# Patient Record
Sex: Female | Born: 1955 | Race: White | Hispanic: No | Marital: Married | Smoking: Never smoker
Health system: Southern US, Community
[De-identification: ages and names within clinical notes are randomized; demographics above are authoritative.]

## PROBLEM LIST (undated history)

## (undated) DIAGNOSIS — T7840XA Allergy, unspecified, initial encounter: Secondary | ICD-10-CM

## (undated) HISTORY — DX: Allergy, unspecified, initial encounter: T78.40XA

---
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
## 1998-11-07 ENCOUNTER — Other Ambulatory Visit: Admission: RE | Admit: 1998-11-07 | Discharge: 1998-11-07 | Payer: Self-pay | Admitting: Obstetrics and Gynecology

## 1999-12-08 ENCOUNTER — Other Ambulatory Visit: Admission: RE | Admit: 1999-12-08 | Discharge: 1999-12-08 | Payer: Self-pay | Admitting: Obstetrics and Gynecology

## 2000-12-31 ENCOUNTER — Other Ambulatory Visit: Admission: RE | Admit: 2000-12-31 | Discharge: 2000-12-31 | Payer: Self-pay | Admitting: Obstetrics and Gynecology

## 2002-03-16 ENCOUNTER — Other Ambulatory Visit: Admission: RE | Admit: 2002-03-16 | Discharge: 2002-03-16 | Payer: Self-pay | Admitting: Obstetrics and Gynecology

## 2010-05-23 ENCOUNTER — Ambulatory Visit: Payer: Self-pay | Admitting: Family Medicine

## 2010-11-07 ENCOUNTER — Encounter: Payer: Self-pay | Admitting: Medical

## 2010-11-07 ENCOUNTER — Ambulatory Visit (INDEPENDENT_AMBULATORY_CARE_PROVIDER_SITE_OTHER): Payer: BC Managed Care – PPO | Admitting: Medical

## 2010-11-07 VITALS — BP 110/88 | HR 80 | Temp 98.1°F | Wt 150.0 lb

## 2010-11-07 DIAGNOSIS — J309 Allergic rhinitis, unspecified: Secondary | ICD-10-CM

## 2010-11-07 DIAGNOSIS — J329 Chronic sinusitis, unspecified: Secondary | ICD-10-CM

## 2010-11-07 MED ORDER — FLUTICASONE PROPIONATE 50 MCG/ACT NA SUSP: 1.0000 | Freq: Every day | NASAL | Status: DC

## 2010-11-07 MED ORDER — AMOXICILLIN-POT CLAVULANATE 875-125 MG PO TABS: 1.0000 | ORAL_TABLET | Freq: Two times a day (BID) | ORAL | Status: AC

## 2010-11-07 NOTE — Progress Notes (Signed)
  Subjective:     Christine Downs is a 55 y.o. female who presents for evaluation of sinus pain. Symptoms include: congestion, cough, facial pain, frequent clearing of the throat, headaches, itchy eyes, itchy nose, nasal congestion, post nasal drip, sinus pressure and sore throat. Onset of symptoms was 3 weeks ago. Symptoms have been gradually worsening since that time. Past history is significant for Allergic rhinitis, history of sinus problems.. Patient is a non-smoker.  Using Allegra-D for symptoms. Has allergy problems in general, but Allegra d has not been helping. Has been on nasal spray before and like a refill of this.  Denies sick contacts.  No other aggravating or relieving factors.  No other c/o.  The following portions of the patient's history were reviewed and updated as appropriate: allergies, current medications, past family history, past medical history, past social history, past surgical history and problem list.  Review of Systems Constitutional: denies fever, chills, sweats, anorexia Skin: denies rash HEENT: denies ear pain Cardiovascular: denies chest pain Lungs: denies wheezing Abdomen: denies abdominal pain, nausea, vomiting, diarrhea GU: denies dysuria  Objective:   Filed Vitals:   11/07/10 1422  BP: 110/88  Pulse: 80  Temp: 98.1 F (36.7 C)    General appearance: Alert, WD/WN, no distress                             Skin: warm, no rash                           Head: + Maxillary sinus tenderness                            Eyes: conjunctiva normal, corneas clear, PERRLA                            Ears: pearly TMs, external ear canals normal                          Nose: septum midline, turbinates swollen, with erythema and clear discharge             Mouth/throat: MMM, tongue normal, mild pharyngeal erythema                           Neck: supple, no adenopathy, no thyromegaly, non tender                          Heart: RRR, normal S1, S2, no murmurs                Lungs: CTA bilaterally, no wheezes, rales, or rhonchi      Assessment:    Encounter Diagnoses  Name Primary?  . Sinusitis Yes  . Allergic rhinitis     Plan:   Prescription given for Augmentin for sinusitis. Fluticasone for allergic rhinitis. Can continue the Allegra D. for congestion.  Tylenol or Ibuprofen OTC for fever and malaise.  Discussed symptomatic relief, nasal saline, and call or return if worse or not improving in 2-3 days.

## 2010-11-07 NOTE — Patient Instructions (Signed)

## 2010-12-12 ENCOUNTER — Encounter: Payer: Self-pay | Admitting: Medical

## 2010-12-12 ENCOUNTER — Ambulatory Visit (INDEPENDENT_AMBULATORY_CARE_PROVIDER_SITE_OTHER): Payer: BC Managed Care – PPO | Admitting: Medical

## 2010-12-12 VITALS — BP 110/80 | HR 76 | Temp 98.3°F | Ht 65.0 in | Wt 150.0 lb

## 2010-12-12 DIAGNOSIS — J40 Bronchitis, not specified as acute or chronic: Secondary | ICD-10-CM

## 2010-12-12 MED ORDER — PREDNISONE 20 MG PO TABS: ORAL_TABLET | ORAL | Status: DC

## 2010-12-12 MED ORDER — AZITHROMYCIN 250 MG PO TABS: ORAL_TABLET | ORAL | Status: AC

## 2010-12-12 NOTE — Progress Notes (Signed)
  Subjective:     Christine Downs is a 55 y.o. female who presents for evaluation of productive cough with sputum described as yellow.  Symptoms include congestion and post nasal drip. She saw me one month ago for a sinus infection. I had prescribed Augmentin, but she thinks the pharmacy gave her amoxicillin and stated. The sinus pressure has resolved, but she thinks she never fully got over symptoms and now is in her chest. In the past Z-Pak has worked better for this type of thing for her.  Denies sick contacts.  No other aggravating or relieving factors.  No other c/o.  The following portions of the patient's history were reviewed and updated as appropriate: allergies, current medications, past family history, past medical history, past social history, past surgical history and problem list.  Past Medical History  Diagnosis Date  . Chronic sinusitis   . Allergy     Review of Systems Constitutional:  denies fever, chills, sweats, anorexia Skin: denies rash HEENT: denies ear pain, sore throat, itchy watery eyes Cardiovascular: denies chest pain, palpitations Lungs:  +productive sputum; denies wheezing, hemoptysis, orthopnea, PND Abdomen: denies abdominal pain, nausea, vomiting, diarrhea GU: denies dysuria Extremities: denies edema, myalgias, arthralgias  Objective:   Filed Vitals:   12/12/10 1500  BP: 110/80  Pulse: 76  Temp: 98.3 F (36.8 C)    General appearance: Alert, WD/WN, no distress, ill appearing                             Skin: warm, no rash, no diaphoresis                           Head: no sinus tenderness                            Eyes: conjunctiva normal, corneas clear, PERRLA                            Ears: Serous effusions behind both TMs, external ear canals normal                          Nose: septum midline, turbinates swollen, with erythema and clear discharge             Mouth/throat: MMM, tongue normal, mild pharyngeal erythema   Neck: supple, no adenopathy, no thyromegaly, nontender                          Heart: RRR, normal S1, S2, no murmurs                         Lungs: +bronchial breath sounds, +scattered rhonchi on the right, no wheezes, no rales                Extremities: no edema, nontender     Assessment:   Encounter Diagnosis  Name Primary?  . Bronchitis Yes    Plan:   Prescription given today for Z-Pak as below.  Discussed diagnosis and treatment of bronchitis.  Suggested symptomatic OTC remedies for cough and congestion.  Nasal saline spray for nasal congestion.  Tylenol or Ibuprofen OTC for fever and malaise.  However if worsening or not improving call or return.

## 2010-12-15 ENCOUNTER — Encounter: Payer: Self-pay | Admitting: Family Medicine

## 2011-06-23 HISTORY — PX: OTHER SURGICAL HISTORY: SHX169

## 2011-09-30 ENCOUNTER — Other Ambulatory Visit: Payer: Self-pay | Admitting: Family Medicine

## 2011-09-30 DIAGNOSIS — Z1231 Encounter for screening mammogram for malignant neoplasm of breast: Secondary | ICD-10-CM

## 2011-10-15 ENCOUNTER — Encounter: Payer: Self-pay | Admitting: Family Medicine

## 2011-10-15 ENCOUNTER — Ambulatory Visit (INDEPENDENT_AMBULATORY_CARE_PROVIDER_SITE_OTHER): Payer: BC Managed Care – PPO | Admitting: Family Medicine

## 2011-10-15 ENCOUNTER — Other Ambulatory Visit (HOSPITAL_COMMUNITY)
Admission: RE | Admit: 2011-10-15 | Discharge: 2011-10-15 | Disposition: A | Discharge status: Home / Self Care | Payer: BC Managed Care – PPO | Source: Ambulatory Visit | Attending: Family Medicine | Admitting: Family Medicine

## 2011-10-15 VITALS — BP 130/80 | HR 67 | Ht 65.5 in | Wt 156.0 lb

## 2011-10-15 DIAGNOSIS — Z23 Encounter for immunization: Secondary | ICD-10-CM

## 2011-10-15 DIAGNOSIS — Z01419 Encounter for gynecological examination (general) (routine) without abnormal findings: Secondary | ICD-10-CM | POA: Insufficient documentation

## 2011-10-15 DIAGNOSIS — J301 Allergic rhinitis due to pollen: Secondary | ICD-10-CM

## 2011-10-15 DIAGNOSIS — Z0000 Encounter for general adult medical examination without abnormal findings: Secondary | ICD-10-CM

## 2011-10-15 LAB — CBC WITH DIFFERENTIAL/PLATELET
Basophils Absolute: 0 10*3/uL (ref 0.0–0.1)
Basophils Relative: 1 % (ref 0–1)
Eosinophils Relative: 3 % (ref 0–5)
HCT: 40.1 % (ref 36.0–46.0)
Lymphocytes Relative: 39 % (ref 12–46)
MCHC: 33.2 g/dL (ref 30.0–36.0)
MCV: 92 fL (ref 78.0–100.0)
Monocytes Absolute: 0.4 10*3/uL (ref 0.1–1.0)
Monocytes Relative: 7 % (ref 3–12)
RDW: 13.9 % (ref 11.5–15.5)

## 2011-10-15 LAB — LIPID PANEL
Cholesterol: 227 mg/dL — ABNORMAL HIGH (ref 0–200)
HDL: 71 mg/dL (ref >39)
LDL Cholesterol: 140 mg/dL — ABNORMAL HIGH (ref 0–99)
Total CHOL/HDL Ratio: 3.2 Ratio
Triglycerides: 82 mg/dL (ref <150)
VLDL: 16 mg/dL (ref 0–40)

## 2011-10-15 LAB — POCT URINALYSIS DIPSTICK
Blood, UA: NEGATIVE
Glucose, UA: NEGATIVE
Nitrite, UA: NEGATIVE
Protein, UA: NEGATIVE
Spec Grav, UA: 1.01
Urobilinogen, UA: NEGATIVE
pH, UA: 7

## 2011-10-15 LAB — COMPREHENSIVE METABOLIC PANEL
AST: 19 U/L (ref 0–37)
BUN: 18 mg/dL (ref 6–23)
Calcium: 10.2 mg/dL (ref 8.4–10.5)
Chloride: 106 mEq/L (ref 96–112)
Creat: 0.87 mg/dL (ref 0.50–1.10)
Glucose, Bld: 95 mg/dL (ref 70–99)

## 2011-10-15 MED ORDER — FLUTICASONE PROPIONATE 50 MCG/ACT NA SUSP: 1.0000 | Freq: Every day | NASAL | Status: AC

## 2011-10-15 NOTE — Progress Notes (Signed)
  Subjective:    Patient ID: Christine Downs, female    DOB: [birth year], 56 y.o.   MRN: [id number]  HPI She is here for complete examination. She does have underlying allergies and uses Flonase for this as well as Zyrtec. 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 Her activity level is somewhat limited. She drinks socially. Her marriage is going well. She does not smoke cigarettes.   Review of Systems  Constitutional: Negative.   HENT: Negative.   Eyes: Negative.   Respiratory: Negative.   Cardiovascular: Negative.   Gastrointestinal: Negative.   Genitourinary: Negative.   Musculoskeletal: Negative.   Skin: Negative.   Neurological: Negative.   Hematological: Negative.   Psychiatric/Behavioral: Negative.        Objective:   Physical Exam BP 130/80  Pulse 67  Ht 5' 5.5" (1.664 m)  Wt 156 lb (70.761 kg)  BMI 25.56 kg/m2  General Appearance:    Alert, cooperative, no distress, appears stated age  Head:    Normocephalic, without obvious abnormality, atraumatic  Eyes:    PERRL, conjunctiva/corneas clear, EOM's intact, fundi    benign  Ears:    Normal TM's and external ear canals  Nose:   Nares normal, mucosa normal, no drainage or sinus   tenderness  Throat:   Lips, mucosa, and tongue normal; teeth and gums normal  Neck:   Supple, no lymphadenopathy;  thyroid:  no   enlargement/tenderness/nodules; no carotid   bruit or JVD  Back:    Spine nontender, no curvature, ROM normal, no CVA     tenderness  Lungs:     Clear to auscultation bilaterally without wheezes, rales or     ronchi; respirations unlabored  Chest Wall:    No tenderness or deformity   Heart:    Regular rate and rhythm, S1 and S2 normal, no murmur, rub   or gallop  Breast Exam:   deferred patient will be going for mammogram   Abdomen:     Soft, non-tender, nondistended, normoactive bowel sounds,    no masses, no hepatosplenomegaly  Genitalia:    Normal external genitalia without lesions.  BUS and vagina normal;  cervix without lesions, or cervical motion tenderness. No abnormal vaginal discharge.  Uterus and adnexa not enlarged, nontender, no masses.  Pap performed  Rectal:    deferred   Extremities:   No clubbing, cyanosis or edema  Pulses:   2+ and symmetric all extremities  Skin:   Skin color, texture, turgor normal, no rashes or lesions  Lymph nodes:   Cervical, supraclavicular, and axillary nodes normal  Neurologic:   CNII-XII intact, normal strength, sensation and gait; reflexes 2+ and symmetric throughout          Psych:   Normal mood, affect, hygiene and grooming.          Assessment & Plan:   1. Routine general medical examination at a health care facility  Tdap vaccine greater than or equal to 7yo IM, CBC with Differential, Comprehensive metabolic panel, Lipid panel, Cytology - PAP, POCT Urinalysis Dipstick  2. Allergic rhinitis due to pollen     she will check with her insurance currently concerning getting a colonoscopy. Discussed various options with her. I will renew her Flonase. Discussed making dietary and exercise changes. Specifically mentioned cutting back on carbohydrates. He Also given.

## 2011-10-28 ENCOUNTER — Ambulatory Visit
Admission: RE | Admit: 2011-10-28 | Discharge: 2011-10-28 | Disposition: A | Discharge status: Home / Self Care | Payer: BC Managed Care – PPO | Source: Ambulatory Visit | Attending: Family Medicine | Admitting: Family Medicine

## 2011-10-28 DIAGNOSIS — Z1231 Encounter for screening mammogram for malignant neoplasm of breast: Secondary | ICD-10-CM

## 2012-04-07 ENCOUNTER — Telehealth: Payer: Self-pay | Admitting: Family Medicine

## 2012-04-07 NOTE — Telephone Encounter (Signed)
This is okay.

## 2012-04-07 NOTE — Telephone Encounter (Signed)
I think this was to come to you 

## 2012-04-11 ENCOUNTER — Other Ambulatory Visit (INDEPENDENT_AMBULATORY_CARE_PROVIDER_SITE_OTHER): Payer: BC Managed Care – PPO

## 2012-04-11 DIAGNOSIS — Z23 Encounter for immunization: Secondary | ICD-10-CM

## 2012-06-17 ENCOUNTER — Other Ambulatory Visit: Payer: Self-pay | Admitting: Medical

## 2012-06-17 ENCOUNTER — Telehealth: Payer: Self-pay | Admitting: Internal Medicine

## 2012-06-17 MED ORDER — SCOPOLAMINE 1 MG/3DAYS TD PT72: 1.0000 | MEDICATED_PATCH | TRANSDERMAL | Status: DC

## 2012-06-17 NOTE — Telephone Encounter (Signed)
pt is leaving sunday to go on a cruise and would like the sea sick patch. send to [facility] on [address] and [address]

## 2012-06-21 NOTE — Telephone Encounter (Signed)
Done

## 2012-12-08 ENCOUNTER — Encounter: Payer: Self-pay | Admitting: Family Medicine

## 2012-12-08 ENCOUNTER — Encounter: Payer: Self-pay | Admitting: Internal Medicine

## 2012-12-08 ENCOUNTER — Ambulatory Visit (INDEPENDENT_AMBULATORY_CARE_PROVIDER_SITE_OTHER): Payer: BC Managed Care – PPO | Admitting: Family Medicine

## 2012-12-08 VITALS — BP 120/70 | HR 78 | Ht 64.0 in | Wt 146.0 lb

## 2012-12-08 DIAGNOSIS — Z0000 Encounter for general adult medical examination without abnormal findings: Secondary | ICD-10-CM

## 2012-12-08 DIAGNOSIS — J309 Allergic rhinitis, unspecified: Secondary | ICD-10-CM

## 2012-12-08 LAB — POCT URINALYSIS DIPSTICK
Blood, UA: NEGATIVE
Ketones, UA: NEGATIVE
Protein, UA: NEGATIVE
Spec Grav, UA: <=1.005
Urobilinogen, UA: NEGATIVE

## 2012-12-08 NOTE — Progress Notes (Signed)
  Subjective:    Patient ID: Christine Downs, female    DOB: 06/02/1956, 57 y.o.   MRN: [id number]  HPI He is here for complete examination. She does have underlying allergies and is on a nasal spray and Zyrtec 10 having no difficulty with this. She has no other concerns or complaints. Review of her record indicates her immunizations are up-to-date and she does need a colonoscopy. Social and family history were reviewed.   Review of Systems  Constitutional: Negative.   HENT: Negative.   Eyes: Negative.   Respiratory: Negative.   Cardiovascular: Negative.   Endocrine: Negative.   Genitourinary: Negative.   Allergic/Immunologic: Negative.   Neurological: Negative.   Hematological: Negative.   Psychiatric/Behavioral: Negative.        Objective:   Physical Exam BP 120/70  Pulse 78  Ht 5\' 4"  (1.626 m)  Wt 146 lb (66.225 kg)  BMI 25.05 kg/m2  General Appearance:    Alert, cooperative, no distress, appears stated age  Head:    Normocephalic, without obvious abnormality, atraumatic  Eyes:    PERRL, conjunctiva/corneas clear, EOM's intact, fundi    benign  Ears:    Normal TM's and external ear canals  Nose:   Nares normal, mucosa normal, no drainage or sinus   tenderness  Throat:   Lips, mucosa, and tongue normal; teeth and gums normal  Neck:   Supple, no lymphadenopathy;  thyroid:  no   enlargement/tenderness/nodules; no carotid   bruit or JVD  Back:    Spine nontender, no curvature, ROM normal, no CVA     tenderness  Lungs:     Clear to auscultation bilaterally without wheezes, rales or     ronchi; respirations unlabored  Chest Wall:    No tenderness or deformity   Heart:    Regular rate and rhythm, S1 and S2 normal, no murmur, rub   or gallop  Breast Exam:    Deferred to GYN  Abdomen:     Soft, non-tender, nondistended, normoactive bowel sounds,    no masses, no hepatosplenomegaly  Genitalia:    Deferred to GYN     Extremities:   No clubbing, cyanosis or edema  Pulses:   2+  and symmetric all extremities  Skin:   Skin color, texture, turgor normal, no rashes or lesions  Lymph nodes:   Cervical, supraclavicular, and axillary nodes normal  Neurologic:   CNII-XII intact, normal strength, sensation and gait; reflexes 2+ and symmetric throughout          Psych:   Normal mood, affect, hygiene and grooming.           Assessment & Plan:  Routine general medical examination at a health care facility - Plan: POCT urinalysis dipstick, Lipid panel, HM COLONOSCOPY  Allergic rhinitis

## 2012-12-09 LAB — LIPID PANEL
Cholesterol: 211 mg/dL — ABNORMAL HIGH (ref 0–200)
LDL Cholesterol: 124 mg/dL — ABNORMAL HIGH (ref 0–99)
Total CHOL/HDL Ratio: 2.9 Ratio
VLDL: 14 mg/dL (ref 0–40)

## 2012-12-09 NOTE — Progress Notes (Signed)
Quick Note:  CALLED PT SHE WAS INFORMED LIPIDS LOOK GOOD AND VERBALIZED UNDERSTANDING ______

## 2013-02-07 ENCOUNTER — Ambulatory Visit (AMBULATORY_SURGERY_CENTER): Payer: BC Managed Care – PPO | Admitting: *Deleted

## 2013-02-07 ENCOUNTER — Encounter: Payer: Self-pay | Admitting: Internal Medicine

## 2013-02-07 VITALS — Ht 64.5 in | Wt 150.6 lb

## 2013-02-07 DIAGNOSIS — Z1211 Encounter for screening for malignant neoplasm of colon: Secondary | ICD-10-CM

## 2013-02-07 MED ORDER — MOVIPREP 100 G PO SOLR: ORAL | Status: DC

## 2013-02-07 NOTE — Progress Notes (Signed)
No allergies to eggs or soy. No problems with anesthesia.  

## 2013-02-16 ENCOUNTER — Encounter: Payer: BC Managed Care – PPO | Admitting: Internal Medicine

## 2013-02-21 ENCOUNTER — Telehealth: Payer: Self-pay | Admitting: Internal Medicine

## 2013-02-21 NOTE — Telephone Encounter (Signed)
Yes. Appears to be a habitual canceler.

## 2013-02-23 ENCOUNTER — Encounter: Payer: BC Managed Care – PPO | Admitting: Internal Medicine

## 2013-04-27 ENCOUNTER — Other Ambulatory Visit: Payer: Self-pay

## 2014-04-06 ENCOUNTER — Other Ambulatory Visit: Payer: Self-pay

## 2014-06-05 ENCOUNTER — Encounter: Payer: Self-pay | Admitting: Medical

## 2014-06-05 ENCOUNTER — Ambulatory Visit (INDEPENDENT_AMBULATORY_CARE_PROVIDER_SITE_OTHER): Payer: BC Managed Care – PPO | Admitting: Medical

## 2014-06-05 VITALS — BP 120/80 | HR 62 | Temp 98.7°F | Wt 167.0 lb

## 2014-06-05 DIAGNOSIS — J0110 Acute frontal sinusitis, unspecified: Secondary | ICD-10-CM

## 2014-06-05 MED ORDER — PREDNISONE 20 MG PO TABS: ORAL_TABLET | ORAL | Status: DC

## 2014-06-05 MED ORDER — AZITHROMYCIN 250 MG PO TABS: ORAL_TABLET | ORAL | Status: DC

## 2014-06-05 NOTE — Progress Notes (Signed)
Subjective:  Christine Downs is a 58 y.o. female who presents for possible sinus infection.  Symptoms include 3 weeks of head congestion, sinus pressure, green nasal drainage, sore throat from drainage, coughing up some phlegm from drainage, some aches, fatigue.   Uses nasonex, but this seems to hurt now.  Has had subjective fever.  Using Advil q6 hours.    Denies ear pain, NVD.  Patient is a non-smoker.  Using Sudafed, Advil, and Nasonex for symptoms.  Denies sick contacts.  Going out of the country Sunday to [place] with [place] family.  No other aggravating or relieving factors.  No other c/o.  ROS as in subjective   Objective: Filed Vitals:   06/05/14 0904  BP: 120/80  Pulse: 62  Temp: 98.7 F (37.1 C)    General appearance: Alert, WD/WN, no distress                             Skin: warm, no rash                           Head: +frontal sinus tenderness,                            Eyes: conjunctiva normal, corneas clear, PERRLA                            Ears: bilat serous effusions behind TMs, external ear canals normal                          Nose: septum midline, turbinates swollen, with erythema and clear discharge             Mouth/throat: MMM, tongue normal, mild pharyngeal erythema                           Neck: supple, no adenopathy, no thyromegaly, nontender                          Lungs: CTA bilaterally, no wheezes, rales, or rhonchi      Assessment and Plan: Encounter Diagnosis  Name Primary?  . Acute frontal sinusitis, recurrence not specified Yes     Prescription given for few days of prednisone and Zpak.  Can use OTC Mucinex for congestion.  Tylenol or Ibuprofen OTC for fever and malaise.  Discussed symptomatic relief, nasal saline flush, and call or return if worse or not improving in 2-3 days.

## 2014-11-21 ENCOUNTER — Encounter: Payer: Self-pay | Admitting: Medical

## 2014-11-21 ENCOUNTER — Ambulatory Visit (INDEPENDENT_AMBULATORY_CARE_PROVIDER_SITE_OTHER): Payer: BLUE CROSS/BLUE SHIELD | Admitting: Medical

## 2014-11-21 VITALS — BP 110/70 | HR 76 | Temp 98.8°F | Resp 15 | Wt 157.0 lb

## 2014-11-21 DIAGNOSIS — J0110 Acute frontal sinusitis, unspecified: Secondary | ICD-10-CM | POA: Diagnosis not present

## 2014-11-21 MED ORDER — AZITHROMYCIN 250 MG PO TABS: ORAL_TABLET | ORAL | Status: DC

## 2014-11-21 NOTE — Progress Notes (Signed)
Subjective:  Christine Downs is a 59 y.o. female who presents for possible sinus infection.  Symptoms include 1 weeks of head congestion, ear pressure, sinus pressure, green nasal drainage, sore throat from drainage, fatigue.   Uses Flonase and some sudafed.  No fever, no NVD.  Patient is a non-smoker.  Denies sick contacts.  Going out of the country next [place] to [place].  No other aggravating or relieving factors.  No other c/o.  ROS as in subjective   Objective: Filed Vitals:   11/21/14 1505  BP: 110/70  Pulse: 76  Temp: 98.8 F (37.1 C)  Resp: 15    General appearance: Alert, WD/WN, no distress                             Skin: warm, no rash                           Head: +frontal sinus tenderness,                            Eyes: conjunctiva normal, corneas clear, PERRLA                            Ears: flat TMs, external ear canals normal                          Nose: septum midline, turbinates swollen, with erythema and clear discharge             Mouth/throat: MMM, tongue normal, mild pharyngeal erythema                           Neck: supple, no adenopathy, no thyromegaly, nontender                          Lungs: CTA bilaterally, no wheezes, rales, or rhonchi      Assessment and Plan: Encounter Diagnosis  Name Primary?  . Acute frontal sinusitis, recurrence not specified Yes    Prescription given for Zpak at her request.  Can use OTCsudafed for congestion, flonase, neti pot.  Tylenol or Ibuprofen OTC for fever and malaise.  Discussed symptomatic relief, nasal saline flush, and call or return if worse or not improving in 2-3 days.

## 2014-12-10 ENCOUNTER — Telehealth: Payer: Self-pay | Admitting: Medical

## 2014-12-10 ENCOUNTER — Other Ambulatory Visit: Payer: Self-pay | Admitting: Medical

## 2014-12-10 MED ORDER — CEFUROXIME AXETIL 500 MG PO TABS: 500.0000 mg | ORAL_TABLET | Freq: Two times a day (BID) | ORAL | Status: DC

## 2014-12-10 NOTE — Telephone Encounter (Signed)
If not improved on this round, Ceftin, then recheck

## 2014-12-10 NOTE — Telephone Encounter (Signed)
Patient is aware of Shane Tysinger PA message 

## 2014-12-10 NOTE — Telephone Encounter (Signed)
Pt states got better but was on 3 different international flights, after 2nd flight her ear started hurting again.  Sinuses better but not well.  She wants another round of antibiotics and wants steroid that you had discussed send to [facility]

## 2015-04-15 ENCOUNTER — Ambulatory Visit (INDEPENDENT_AMBULATORY_CARE_PROVIDER_SITE_OTHER): Payer: BLUE CROSS/BLUE SHIELD | Admitting: Family Medicine

## 2015-04-15 ENCOUNTER — Encounter: Payer: Self-pay | Admitting: Family Medicine

## 2015-04-15 VITALS — BP 112/70 | HR 76 | Temp 98.1°F | Ht 64.0 in | Wt 153.0 lb

## 2015-04-15 DIAGNOSIS — J3089 Other allergic rhinitis: Secondary | ICD-10-CM

## 2015-04-15 DIAGNOSIS — J208 Acute bronchitis due to other specified organisms: Secondary | ICD-10-CM | POA: Diagnosis not present

## 2015-04-15 DIAGNOSIS — J0110 Acute frontal sinusitis, unspecified: Secondary | ICD-10-CM | POA: Diagnosis not present

## 2015-04-15 DIAGNOSIS — H6980 Other specified disorders of Eustachian tube, unspecified ear: Secondary | ICD-10-CM | POA: Insufficient documentation

## 2015-04-15 DIAGNOSIS — H6982 Other specified disorders of Eustachian tube, left ear: Secondary | ICD-10-CM

## 2015-04-15 DIAGNOSIS — H6990 Unspecified Eustachian tube disorder, unspecified ear: Secondary | ICD-10-CM | POA: Insufficient documentation

## 2015-04-15 MED ORDER — AMOXICILLIN-POT CLAVULANATE 875-125 MG PO TABS: 1.0000 | ORAL_TABLET | Freq: Two times a day (BID) | ORAL | Status: AC

## 2015-04-15 NOTE — Progress Notes (Signed)
   Subjective:    Patient ID: Christine Downs, female    DOB: 05/10/1956, 59 y.o.   MRN: [id number]  HPI She complains of a two-day history that started with the relatively quick onset of hoarse voice and productive cough with a slight sore throat and continued difficulty with left ear pressure. No fever, chills, shortness of breath, chest pain. She does not smoke cigarettes .She has had long difficulty with left ear eustachian tube dysfunction. She notes this especially with flying which she recently did. She has been using Nasonex and did use Sudafed and Afrin for the recent flight however she says it did not help.   Review of Systems     Objective:   Physical Exam Alert and in no distress. Tympanic membranes and canals are normal. Pharyngeal area is normal. Neck is supple without adenopathy or thyromegaly. Cardiac exam shows a regular sinus rhythm without murmurs or gallops. Lungs are clear to auscultation. Nasal mucosa is red and slightly swollen with tenderness over frontal and maxillary sinuses.        Assessment & Plan:  Acute frontal sinusitis, recurrence not specified - Plan: amoxicillin-clavulanate (AUGMENTIN) 875-125 MG tablet  Acute bronchitis due to other specified organisms - Plan: amoxicillin-clavulanate (AUGMENTIN) 875-125 MG tablet  Other allergic rhinitis  recommend she continue to use Afrin and Sudafed when she flies. She will call if no improvement in her sinus symptoms. Continue on Nasonex.

## 2016-03-04 DIAGNOSIS — E8881 Metabolic syndrome: Secondary | ICD-10-CM | POA: Diagnosis not present

## 2016-04-28 DIAGNOSIS — Z0279 Encounter for issue of other medical certificate: Secondary | ICD-10-CM

## 2016-04-29 ENCOUNTER — Other Ambulatory Visit: Payer: Self-pay | Admitting: Orthopedic Surgery

## 2016-04-29 DIAGNOSIS — M67432 Ganglion, left wrist: Secondary | ICD-10-CM | POA: Diagnosis not present

## 2016-06-10 DIAGNOSIS — L648 Other androgenic alopecia: Secondary | ICD-10-CM | POA: Diagnosis not present

## 2016-06-10 DIAGNOSIS — L821 Other seborrheic keratosis: Secondary | ICD-10-CM | POA: Diagnosis not present

## 2016-07-02 ENCOUNTER — Encounter (HOSPITAL_BASED_OUTPATIENT_CLINIC_OR_DEPARTMENT_OTHER): Admission: RE | Payer: Self-pay | Source: Ambulatory Visit

## 2016-07-02 ENCOUNTER — Ambulatory Visit (HOSPITAL_BASED_OUTPATIENT_CLINIC_OR_DEPARTMENT_OTHER)
Admission: RE | Admit: 2016-07-02 | Payer: BLUE CROSS/BLUE SHIELD | Source: Ambulatory Visit | Admitting: Orthopedic Surgery

## 2016-07-02 SURGERY — EXCISION MASS
Anesthesia: Choice | Laterality: Left

## 2016-07-20 DIAGNOSIS — D485 Neoplasm of uncertain behavior of skin: Secondary | ICD-10-CM | POA: Diagnosis not present

## 2016-07-20 DIAGNOSIS — D225 Melanocytic nevi of trunk: Secondary | ICD-10-CM | POA: Diagnosis not present

## 2016-07-20 DIAGNOSIS — L814 Other melanin hyperpigmentation: Secondary | ICD-10-CM | POA: Diagnosis not present

## 2016-07-20 DIAGNOSIS — D2262 Melanocytic nevi of left upper limb, including shoulder: Secondary | ICD-10-CM | POA: Diagnosis not present

## 2016-07-20 DIAGNOSIS — L821 Other seborrheic keratosis: Secondary | ICD-10-CM | POA: Diagnosis not present

## 2016-08-14 ENCOUNTER — Encounter: Payer: Self-pay | Admitting: Family Medicine

## 2016-08-17 DIAGNOSIS — J019 Acute sinusitis, unspecified: Secondary | ICD-10-CM | POA: Diagnosis not present

## 2016-08-20 DIAGNOSIS — D485 Neoplasm of uncertain behavior of skin: Secondary | ICD-10-CM | POA: Diagnosis not present

## 2016-08-20 DIAGNOSIS — L988 Other specified disorders of the skin and subcutaneous tissue: Secondary | ICD-10-CM | POA: Diagnosis not present

## 2016-09-01 ENCOUNTER — Encounter: Payer: Self-pay | Admitting: Family Medicine

## 2017-02-12 DIAGNOSIS — H43823 Vitreomacular adhesion, bilateral: Secondary | ICD-10-CM | POA: Diagnosis not present

## 2017-06-29 DIAGNOSIS — Z719 Counseling, unspecified: Secondary | ICD-10-CM | POA: Diagnosis not present

## 2017-06-29 DIAGNOSIS — Z01818 Encounter for other preprocedural examination: Secondary | ICD-10-CM | POA: Diagnosis not present

## 2017-08-19 DIAGNOSIS — H35372 Puckering of macula, left eye: Secondary | ICD-10-CM | POA: Diagnosis not present

## 2017-09-23 DIAGNOSIS — D225 Melanocytic nevi of trunk: Secondary | ICD-10-CM | POA: Diagnosis not present

## 2017-09-23 DIAGNOSIS — D2271 Melanocytic nevi of right lower limb, including hip: Secondary | ICD-10-CM | POA: Diagnosis not present

## 2017-09-23 DIAGNOSIS — D2261 Melanocytic nevi of right upper limb, including shoulder: Secondary | ICD-10-CM | POA: Diagnosis not present

## 2017-09-23 DIAGNOSIS — D2262 Melanocytic nevi of left upper limb, including shoulder: Secondary | ICD-10-CM | POA: Diagnosis not present

## 2017-10-21 ENCOUNTER — Other Ambulatory Visit: Payer: Self-pay | Admitting: Family Medicine

## 2017-10-21 DIAGNOSIS — Z1231 Encounter for screening mammogram for malignant neoplasm of breast: Secondary | ICD-10-CM

## 2017-10-29 ENCOUNTER — Ambulatory Visit
Admission: RE | Admit: 2017-10-29 | Discharge: 2017-10-29 | Disposition: A | Discharge status: Home / Self Care | Payer: BLUE CROSS/BLUE SHIELD | Source: Ambulatory Visit | Attending: Family Medicine | Admitting: Family Medicine

## 2017-10-29 DIAGNOSIS — Z1231 Encounter for screening mammogram for malignant neoplasm of breast: Secondary | ICD-10-CM | POA: Diagnosis not present

## 2017-11-01 ENCOUNTER — Other Ambulatory Visit: Payer: Self-pay | Admitting: Family Medicine

## 2017-11-01 DIAGNOSIS — R928 Other abnormal and inconclusive findings on diagnostic imaging of breast: Secondary | ICD-10-CM

## 2017-11-08 ENCOUNTER — Ambulatory Visit
Admission: RE | Admit: 2017-11-08 | Discharge: 2017-11-08 | Disposition: A | Discharge status: Home / Self Care | Payer: BLUE CROSS/BLUE SHIELD | Source: Ambulatory Visit | Attending: Family Medicine | Admitting: Family Medicine

## 2017-11-08 DIAGNOSIS — R928 Other abnormal and inconclusive findings on diagnostic imaging of breast: Secondary | ICD-10-CM

## 2017-11-08 DIAGNOSIS — R922 Inconclusive mammogram: Secondary | ICD-10-CM | POA: Diagnosis not present

## 2017-11-08 DIAGNOSIS — N6489 Other specified disorders of breast: Secondary | ICD-10-CM | POA: Diagnosis not present

## 2017-12-27 ENCOUNTER — Encounter: Payer: Self-pay | Admitting: Family Medicine

## 2018-02-08 ENCOUNTER — Other Ambulatory Visit (HOSPITAL_COMMUNITY)
Admission: RE | Admit: 2018-02-08 | Discharge: 2018-02-08 | Disposition: A | Discharge status: Home / Self Care | Payer: BLUE CROSS/BLUE SHIELD | Source: Ambulatory Visit | Attending: Family Medicine | Admitting: Family Medicine

## 2018-02-08 ENCOUNTER — Encounter: Payer: Self-pay | Admitting: Family Medicine

## 2018-02-08 ENCOUNTER — Ambulatory Visit (INDEPENDENT_AMBULATORY_CARE_PROVIDER_SITE_OTHER): Payer: BLUE CROSS/BLUE SHIELD | Admitting: Family Medicine

## 2018-02-08 VITALS — BP 108/70 | HR 87 | Temp 97.5°F | Ht 63.75 in | Wt 144.6 lb

## 2018-02-08 DIAGNOSIS — Z0000 Encounter for general adult medical examination without abnormal findings: Secondary | ICD-10-CM | POA: Diagnosis not present

## 2018-02-08 DIAGNOSIS — J309 Allergic rhinitis, unspecified: Secondary | ICD-10-CM

## 2018-02-08 DIAGNOSIS — Z124 Encounter for screening for malignant neoplasm of cervix: Secondary | ICD-10-CM | POA: Diagnosis not present

## 2018-02-08 DIAGNOSIS — L659 Nonscarring hair loss, unspecified: Secondary | ICD-10-CM | POA: Diagnosis not present

## 2018-02-08 DIAGNOSIS — Z23 Encounter for immunization: Secondary | ICD-10-CM | POA: Diagnosis not present

## 2018-02-08 DIAGNOSIS — Z1211 Encounter for screening for malignant neoplasm of colon: Secondary | ICD-10-CM

## 2018-02-08 LAB — POCT URINALYSIS DIP (PROADVANTAGE DEVICE)
Bilirubin, UA: NEGATIVE
Glucose, UA: NEGATIVE mg/dL
Ketones, POC UA: NEGATIVE mg/dL
Nitrite, UA: NEGATIVE
PROTEIN UA: NEGATIVE mg/dL
RBC UA: NEGATIVE
Specific Gravity, Urine: 1.01
UUROB: 3.5
pH, UA: 6 (ref 5.0–8.0)

## 2018-02-08 NOTE — Progress Notes (Signed)
Subjective:    Patient ID: Christine Downs, female    DOB: 07/26/1955, 62 y.o.   MRN: [id number]  HPI She is here for complete examination.  She does have underlying allergies but presently is having no difficulty with that.  She does see her dermatologist regularly and does have some evidence of alopecia.  Presently she is taking Propecia and would like some blood work done concerning that.  She has no other concerns or complaints.  No chest pain, GI issues.  She has not had a colonoscopy done yet.  She has had a mammogram but does need a Pap.  She does work from home.  Her home life is going well.  She is in stable marriage.  Family and social history as well as health maintenance and immunizations was reviewed   Review of Systems  All other systems reviewed and are negative.      Objective:   Physical Exam BP 108/70 (BP Location: Left Arm, Patient Position: Sitting)   Pulse 87   Temp (!) 97.5 F (36.4 C)   Ht 5' 3.75" (1.619 m)   Wt 144 lb 9.6 oz (65.6 kg)   SpO2 96%   BMI 25.02 kg/m   General Appearance:    Alert, cooperative, no distress, appears stated age  Head:    Normocephalic, without obvious abnormality, atraumatic  Eyes:    PERRL, conjunctiva/corneas clear, EOM's intact, fundi    benign  Ears:    Normal TM's and external ear canals  Nose:   Nares normal, mucosa normal, no drainage or sinus   tenderness  Throat:   Lips, mucosa, and tongue normal; teeth and gums normal  Neck:   Supple, no lymphadenopathy;  thyroid:  no   enlargement/tenderness/nodules; no carotid   bruit or JVD  Back:    Spine nontender, no curvature, ROM normal, no CVA     tenderness  Lungs:     Clear to auscultation bilaterally without wheezes, rales or     ronchi; respirations unlabored  Chest Wall:    No tenderness or deformity   Heart:    Regular rate and rhythm, S1 and S2 normal, no murmur, rub   or gallop  Breast Exam:   Deferred  Abdomen:     Soft, non-tender, nondistended, normoactive bowel  sounds,    no masses, no hepatosplenomegaly  Genitalia:    Normal external genitalia without lesions.  BUS and vagina normal; cervix without lesions, or cervical motion tenderness. No abnormal vaginal discharge.  Uterus and adnexa not enlarged, nontender, no masses.  Pap performed  Rectal:   Deferred   Extremities:   No clubbing, cyanosis or edema  Pulses:   2+ and symmetric all extremities  Skin:   Skin color, texture, turgor normal, no rashes or lesions  Lymph nodes:   Cervical, supraclavicular, and axillary nodes normal  Neurologic:   CNII-XII intact, normal strength, sensation and gait; reflexes 2+ and symmetric throughout          Psych:   Normal mood, affect, hygiene and grooming.         Assessment & Plan:  Routine general medical examination at a health care facility - Plan: CBC with Differential/Platelet, Comprehensive metabolic panel, Lipid panel  Allergic rhinitis, unspecified seasonality, unspecified trigger  Need for influenza vaccination - Plan: Flu Vaccine QUAD 6+ mos PF IM (Fluarix Quad PF)  Need for shingles vaccine - Plan: Varicella-zoster vaccine IM (Shingrix)  Screening for colon cancer - Plan: Cologuard  Alopecia -  Plan: CBC with Differential/Platelet, Comprehensive metabolic panel, TSH I encouraged her to continue to take excellent care of herself.  Her allergies are not giving her any trouble.  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  She was comfortable with that.

## 2018-02-09 LAB — CBC WITH DIFFERENTIAL/PLATELET
Basophils Absolute: 0.1 10*3/uL (ref 0.0–0.2)
Basos: 1 %
EOS (ABSOLUTE): 0.2 10*3/uL (ref 0.0–0.4)
Eos: 3 %
HEMATOCRIT: 38.7 % (ref 34.0–46.6)
Hemoglobin: 13.2 g/dL (ref 11.1–15.9)
IMMATURE GRANULOCYTES: 1 %
Immature Grans (Abs): 0 10*3/uL (ref 0.0–0.1)
LYMPHS ABS: 1.9 10*3/uL (ref 0.7–3.1)
LYMPHS: 31 %
MCH: 30.5 pg (ref 26.6–33.0)
MCHC: 34.1 g/dL (ref 31.5–35.7)
MCV: 89 fL (ref 79–97)
MONOCYTES: 7 %
Monocytes Absolute: 0.4 10*3/uL (ref 0.1–0.9)
NEUTROS ABS: 3.5 10*3/uL (ref 1.4–7.0)
Neutrophils: 57 %
Platelets: 377 10*3/uL (ref 150–450)
RBC: 4.33 x10E6/uL (ref 3.77–5.28)
RDW: 14.6 % (ref 12.3–15.4)
WBC: 6.1 10*3/uL (ref 3.4–10.8)

## 2018-02-09 LAB — COMPREHENSIVE METABOLIC PANEL
A/G RATIO: 2.2 (ref 1.2–2.2)
ALT: 14 IU/L (ref 0–32)
AST: 21 IU/L (ref 0–40)
Albumin: 4.9 g/dL — ABNORMAL HIGH (ref 3.6–4.8)
Alkaline Phosphatase: 88 IU/L (ref 39–117)
BILIRUBIN TOTAL: 0.6 mg/dL (ref 0.0–1.2)
BUN/Creatinine Ratio: 16 (ref 12–28)
BUN: 17 mg/dL (ref 8–27)
CALCIUM: 10 mg/dL (ref 8.7–10.3)
CO2: 21 mmol/L (ref 20–29)
Chloride: 106 mmol/L (ref 96–106)
Creatinine, Ser: 1.06 mg/dL — ABNORMAL HIGH (ref 0.57–1.00)
GFR calc Af Amer: 65 mL/min/{1.73_m2} (ref >59)
GFR calc non Af Amer: 56 mL/min/{1.73_m2} — ABNORMAL LOW (ref >59)
GLOBULIN, TOTAL: 2.2 g/dL (ref 1.5–4.5)
Glucose: 88 mg/dL (ref 65–99)
POTASSIUM: 4.4 mmol/L (ref 3.5–5.2)
SODIUM: 142 mmol/L (ref 134–144)
Total Protein: 7.1 g/dL (ref 6.0–8.5)

## 2018-02-09 LAB — LIPID PANEL
Chol/HDL Ratio: 2.7 ratio (ref 0.0–4.4)
Cholesterol, Total: 236 mg/dL — ABNORMAL HIGH (ref 100–199)
HDL: 87 mg/dL (ref >39)
LDL Calculated: 132 mg/dL — ABNORMAL HIGH (ref 0–99)
TRIGLYCERIDES: 83 mg/dL (ref 0–149)
VLDL Cholesterol Cal: 17 mg/dL (ref 5–40)

## 2018-02-09 LAB — TSH: TSH: 0.904 u[IU]/mL (ref 0.450–4.500)

## 2018-02-09 NOTE — Addendum Note (Signed)
Addended by: Victorio PalmJENKINS, [place] on: 02/09/2018 09:31 AM   Modules accepted: Orders

## 2018-02-10 LAB — CYTOLOGY - PAP: Diagnosis: NEGATIVE

## 2018-03-04 ENCOUNTER — Encounter: Payer: Self-pay | Admitting: Family Medicine

## 2018-03-08 DIAGNOSIS — Z1211 Encounter for screening for malignant neoplasm of colon: Secondary | ICD-10-CM | POA: Diagnosis not present

## 2018-03-09 LAB — COLOGUARD: Cologuard: NEGATIVE

## 2018-03-17 DIAGNOSIS — H40013 Open angle with borderline findings, low risk, bilateral: Secondary | ICD-10-CM | POA: Diagnosis not present

## 2018-03-17 DIAGNOSIS — H40053 Ocular hypertension, bilateral: Secondary | ICD-10-CM | POA: Diagnosis not present

## 2018-04-12 ENCOUNTER — Other Ambulatory Visit: Payer: BLUE CROSS/BLUE SHIELD

## 2018-04-19 ENCOUNTER — Other Ambulatory Visit: Payer: BLUE CROSS/BLUE SHIELD

## 2018-04-20 ENCOUNTER — Other Ambulatory Visit: Payer: BLUE CROSS/BLUE SHIELD

## 2018-08-10 ENCOUNTER — Other Ambulatory Visit (INDEPENDENT_AMBULATORY_CARE_PROVIDER_SITE_OTHER): Payer: BLUE CROSS/BLUE SHIELD

## 2018-08-10 DIAGNOSIS — Z23 Encounter for immunization: Secondary | ICD-10-CM | POA: Diagnosis not present

## 2018-09-11 IMAGING — MG DIGITAL DIAGNOSTIC UNILATERAL LEFT MAMMOGRAM WITH TOMO AND CAD
4 series · 4 of 12 positions shown · non-contrast
Comparison: Previous exam(s).

CLINICAL DATA: Screening recall for possible left breast
distortion.

EXAM:
DIGITAL DIAGNOSTIC UNILATERAL LEFT MAMMOGRAM WITH CAD AND TOMO
LEFT BREAST ULTRASOUND

[L MLO synth-2D]
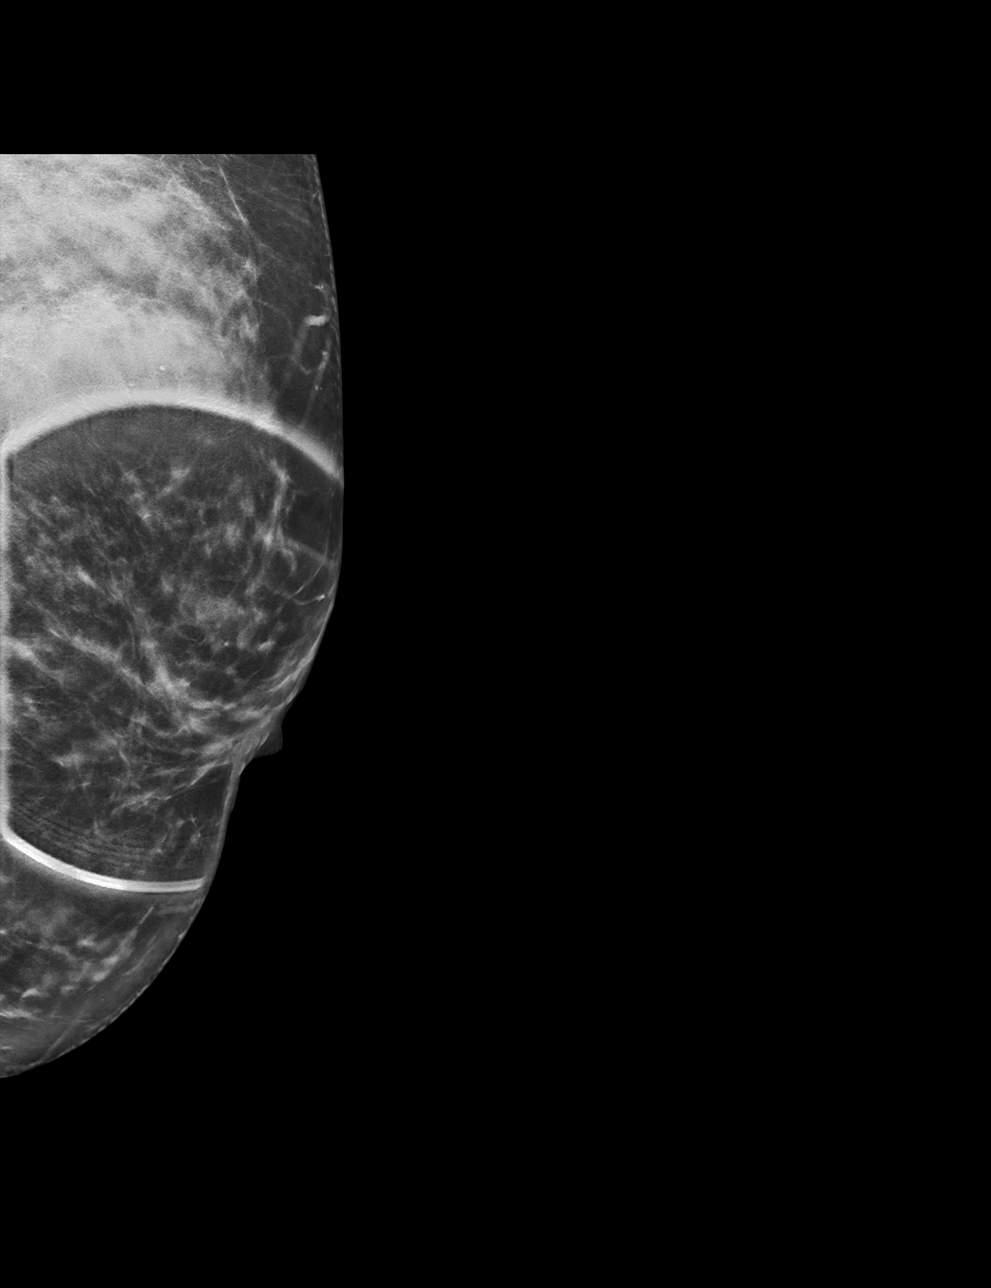

[L CC synth-2D]
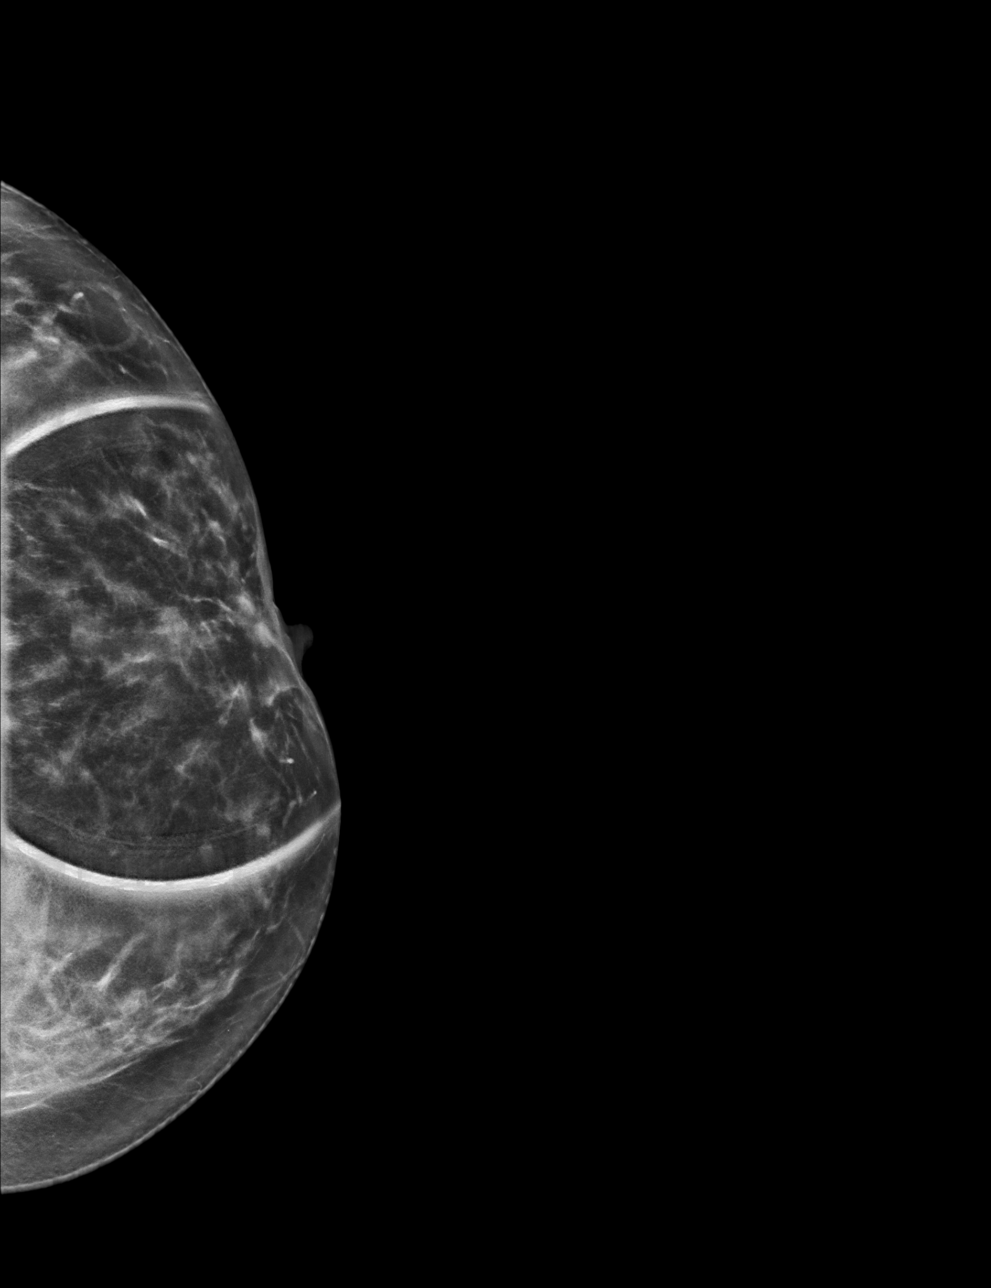

[L MLO tomo · tomo slice 31/60.0]
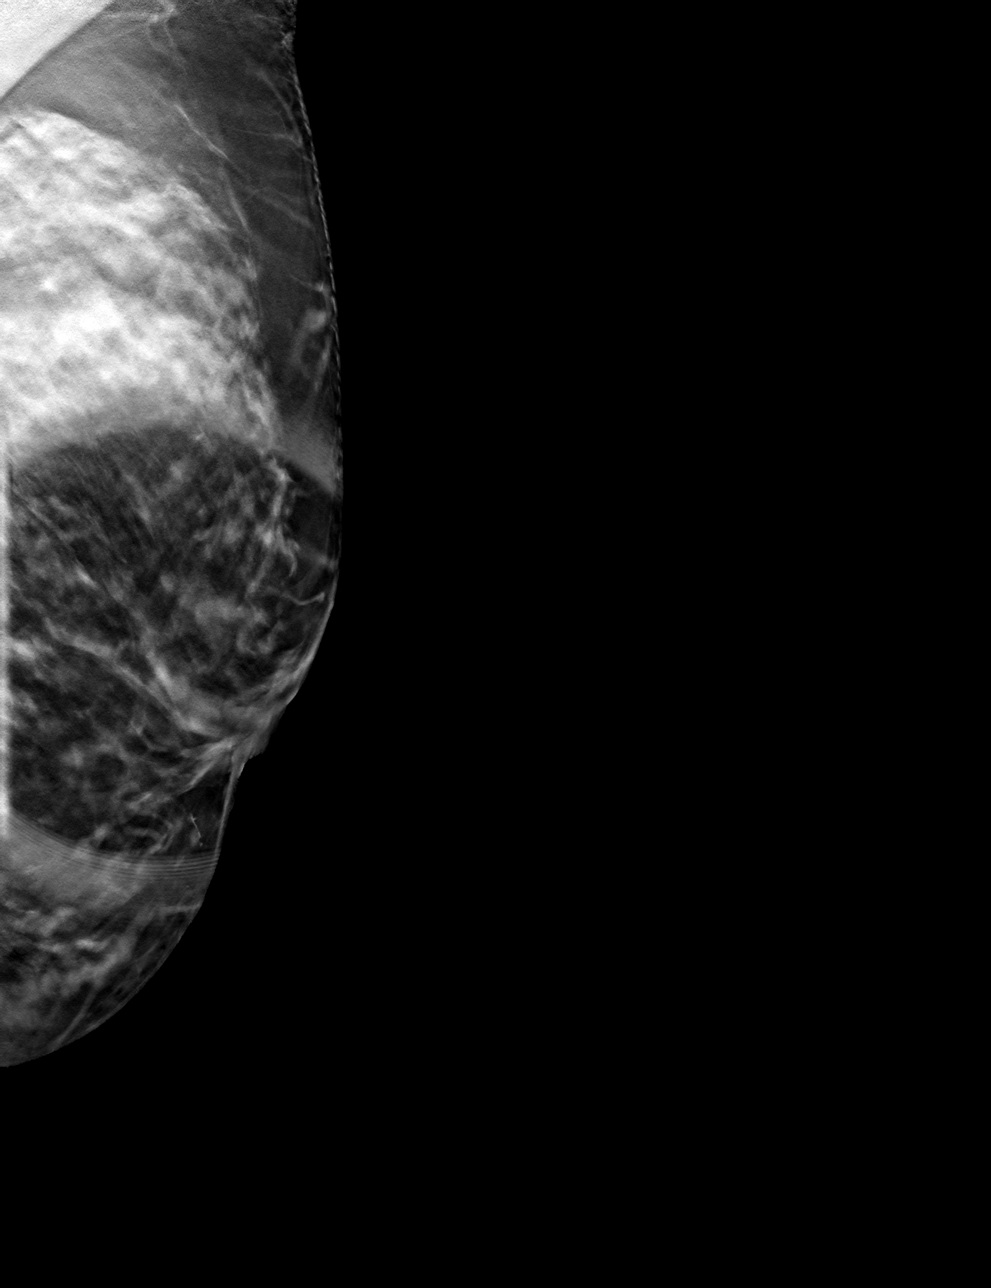

[L CC tomo · tomo slice 29/57.0]
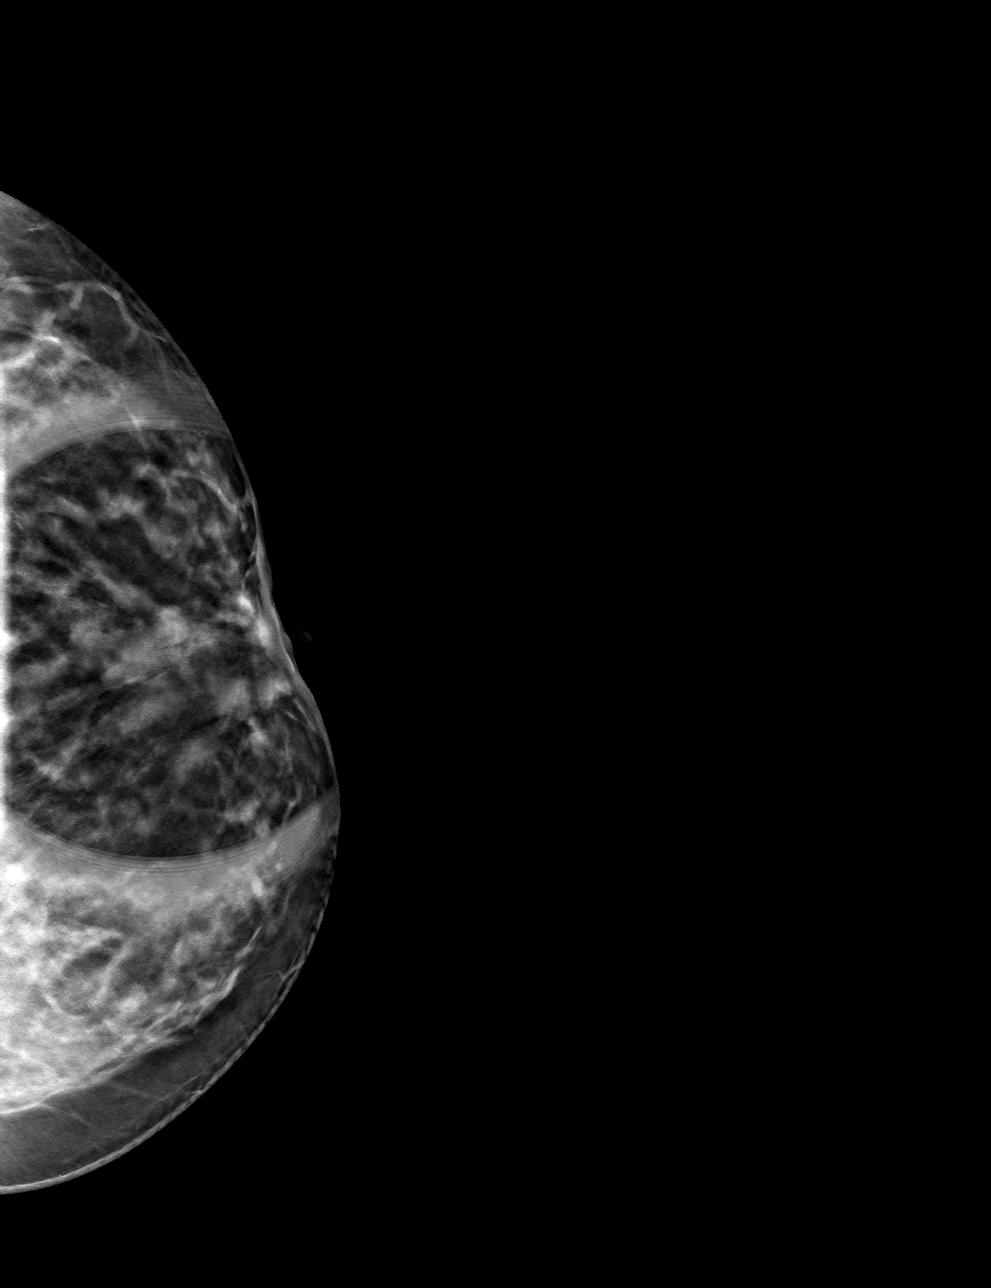

[4 of 12 positions shown; findings below may reference images not displayed]

ACR Breast Density Category c: The breast tissue is heterogeneously
dense, which may obscure small masses.
FINDINGS: Spot compression CC and MLO tomograms were performed over the
subareolar left breast. The initially questioned possible left
breast distortion appears to resolve with findings most suggestive
of overlapping fibroglandular tissue. Heterogeneous tissue in this
location could obscure an abnormality.

Mammographic images were processed with CAD.

No palpable abnormalities in the subareolar left breast.

Targeted ultrasound of the central/subareolar left breast was
performed. No suspicious masses or abnormality seen, only several
mildly dilated ducts identified suggestive of benign ductal ectasia.
No intraductal masses.
IMPRESSION: No findings of malignancy in the left breast.

RECOMMENDATION:
Screening mammogram in one year.(Code:OT-S-VI8)

I have discussed the findings and recommendations with the patient.
Results were also provided in writing at the conclusion of the
visit. If applicable, a reminder letter will be sent to the patient
regarding the next appointment.

BI-RADS CATEGORY  1: Negative.

## 2019-04-27 ENCOUNTER — Other Ambulatory Visit (INDEPENDENT_AMBULATORY_CARE_PROVIDER_SITE_OTHER): Payer: BLUE CROSS/BLUE SHIELD

## 2019-04-27 ENCOUNTER — Other Ambulatory Visit: Payer: Self-pay

## 2019-04-27 DIAGNOSIS — Z23 Encounter for immunization: Secondary | ICD-10-CM

## 2019-09-06 DIAGNOSIS — Z23 Encounter for immunization: Secondary | ICD-10-CM | POA: Diagnosis not present

## 2019-09-27 DIAGNOSIS — Z23 Encounter for immunization: Secondary | ICD-10-CM | POA: Diagnosis not present

## 2019-10-30 ENCOUNTER — Other Ambulatory Visit: Payer: Self-pay | Admitting: Family Medicine

## 2019-10-30 DIAGNOSIS — Z1231 Encounter for screening mammogram for malignant neoplasm of breast: Secondary | ICD-10-CM

## 2020-05-09 DIAGNOSIS — L821 Other seborrheic keratosis: Secondary | ICD-10-CM | POA: Diagnosis not present

## 2020-05-09 DIAGNOSIS — L820 Inflamed seborrheic keratosis: Secondary | ICD-10-CM | POA: Diagnosis not present

## 2020-05-09 DIAGNOSIS — D485 Neoplasm of uncertain behavior of skin: Secondary | ICD-10-CM | POA: Diagnosis not present

## 2020-05-09 DIAGNOSIS — L659 Nonscarring hair loss, unspecified: Secondary | ICD-10-CM | POA: Diagnosis not present

## 2020-05-09 DIAGNOSIS — D235 Other benign neoplasm of skin of trunk: Secondary | ICD-10-CM | POA: Diagnosis not present

## 2020-05-21 DIAGNOSIS — L659 Nonscarring hair loss, unspecified: Secondary | ICD-10-CM | POA: Diagnosis not present

## 2020-12-24 ENCOUNTER — Other Ambulatory Visit: Payer: Self-pay | Admitting: Family Medicine

## 2020-12-24 DIAGNOSIS — Z1231 Encounter for screening mammogram for malignant neoplasm of breast: Secondary | ICD-10-CM
